# Patient Record
Sex: Female | Born: 1975 | Race: White | Hispanic: No | Marital: Married | Smoking: Never smoker
Health system: Southern US, Community
[De-identification: ages and names within clinical notes are randomized; demographics above are authoritative.]

## PROBLEM LIST (undated history)

## (undated) DIAGNOSIS — G4730 Sleep apnea, unspecified: Secondary | ICD-10-CM

## (undated) DIAGNOSIS — I10 Essential (primary) hypertension: Secondary | ICD-10-CM

## (undated) DIAGNOSIS — E119 Type 2 diabetes mellitus without complications: Secondary | ICD-10-CM

## (undated) HISTORY — PX: KNEE ARTHROSCOPY WITH ANTERIOR CRUCIATE LIGAMENT (ACL) REPAIR: SHX5644

## (undated) HISTORY — PX: HERNIA REPAIR: SHX51

---

## 2010-02-27 ENCOUNTER — Ambulatory Visit (HOSPITAL_COMMUNITY): Admission: RE | Admit: 2010-02-27 | Discharge: 2010-02-27 | Payer: Self-pay | Admitting: Specialist

## 2010-03-28 ENCOUNTER — Ambulatory Visit (HOSPITAL_COMMUNITY): Admission: RE | Admit: 2010-03-28 | Discharge: 2010-03-28 | Payer: Self-pay | Admitting: Specialist

## 2010-04-25 ENCOUNTER — Ambulatory Visit (HOSPITAL_COMMUNITY)
Admission: RE | Admit: 2010-04-25 | Discharge: 2010-04-25 | Payer: Self-pay | Source: Home / Self Care | Admitting: Specialist

## 2010-06-09 ENCOUNTER — Ambulatory Visit: Payer: Self-pay | Admitting: Cardiology

## 2017-11-26 ENCOUNTER — Other Ambulatory Visit: Payer: Self-pay | Admitting: Gastroenterology

## 2017-11-26 DIAGNOSIS — R1011 Right upper quadrant pain: Secondary | ICD-10-CM

## 2017-11-26 DIAGNOSIS — R112 Nausea with vomiting, unspecified: Secondary | ICD-10-CM

## 2017-12-09 ENCOUNTER — Encounter (HOSPITAL_COMMUNITY)
Admission: RE | Admit: 2017-12-09 | Discharge: 2017-12-09 | Disposition: A | Discharge status: Home / Self Care | Payer: BLUE CROSS/BLUE SHIELD | Source: Ambulatory Visit | Attending: Gastroenterology | Admitting: Gastroenterology

## 2017-12-09 ENCOUNTER — Encounter (HOSPITAL_COMMUNITY): Payer: Self-pay | Admitting: Radiology

## 2017-12-09 ENCOUNTER — Ambulatory Visit (HOSPITAL_COMMUNITY)
Admission: RE | Admit: 2017-12-09 | Discharge: 2017-12-09 | Disposition: A | Discharge status: Home / Self Care | Payer: BLUE CROSS/BLUE SHIELD | Source: Ambulatory Visit | Attending: Gastroenterology | Admitting: Gastroenterology

## 2017-12-09 DIAGNOSIS — R1011 Right upper quadrant pain: Secondary | ICD-10-CM

## 2017-12-09 DIAGNOSIS — R112 Nausea with vomiting, unspecified: Secondary | ICD-10-CM | POA: Diagnosis present

## 2017-12-09 MED ORDER — TECHNETIUM TC 99M MEBROFENIN IV KIT
5.1400 | PACK | Freq: Once | INTRAVENOUS | Status: AC | PRN
  Administered 2017-12-09: 5.14 via INTRAVENOUS

## 2017-12-10 ENCOUNTER — Other Ambulatory Visit: Payer: Self-pay

## 2017-12-10 ENCOUNTER — Encounter (HOSPITAL_COMMUNITY): Payer: Self-pay | Admitting: Emergency Medicine

## 2017-12-10 ENCOUNTER — Emergency Department (HOSPITAL_COMMUNITY)
Admission: EM | Admit: 2017-12-10 | Discharge: 2017-12-10 | Disposition: A | Discharge status: Home / Self Care | Payer: BLUE CROSS/BLUE SHIELD | Attending: Emergency Medicine | Admitting: Emergency Medicine

## 2017-12-10 DIAGNOSIS — R112 Nausea with vomiting, unspecified: Secondary | ICD-10-CM

## 2017-12-10 DIAGNOSIS — E119 Type 2 diabetes mellitus without complications: Secondary | ICD-10-CM | POA: Diagnosis not present

## 2017-12-10 DIAGNOSIS — R1013 Epigastric pain: Secondary | ICD-10-CM | POA: Diagnosis present

## 2017-12-10 DIAGNOSIS — I10 Essential (primary) hypertension: Secondary | ICD-10-CM | POA: Diagnosis not present

## 2017-12-10 DIAGNOSIS — R1033 Periumbilical pain: Secondary | ICD-10-CM | POA: Insufficient documentation

## 2017-12-10 DIAGNOSIS — R110 Nausea: Secondary | ICD-10-CM | POA: Insufficient documentation

## 2017-12-10 DIAGNOSIS — Z7984 Long term (current) use of oral hypoglycemic drugs: Secondary | ICD-10-CM | POA: Diagnosis not present

## 2017-12-10 DIAGNOSIS — R1010 Upper abdominal pain, unspecified: Secondary | ICD-10-CM

## 2017-12-10 HISTORY — DX: Sleep apnea, unspecified: G47.30

## 2017-12-10 HISTORY — DX: Essential (primary) hypertension: I10

## 2017-12-10 HISTORY — DX: Type 2 diabetes mellitus without complications: E11.9

## 2017-12-10 LAB — CBC
HCT: 44.7 % (ref 36.0–46.0)
HEMOGLOBIN: 14.7 g/dL (ref 12.0–15.0)
MCH: 27.7 pg (ref 26.0–34.0)
MCHC: 32.9 g/dL (ref 30.0–36.0)
MCV: 84.3 fL (ref 78.0–100.0)
Platelets: 335 10*3/uL (ref 150–400)
RBC: 5.3 MIL/uL — AB (ref 3.87–5.11)
RDW: 13.6 % (ref 11.5–15.5)
WBC: 14.3 10*3/uL — AB (ref 4.0–10.5)

## 2017-12-10 LAB — COMPREHENSIVE METABOLIC PANEL
ALK PHOS: 49 U/L (ref 38–126)
ALT: 46 U/L (ref 14–54)
ANION GAP: 12 (ref 5–15)
AST: 26 U/L (ref 15–41)
Albumin: 3.7 g/dL (ref 3.5–5.0)
BILIRUBIN TOTAL: 0.7 mg/dL (ref 0.3–1.2)
BUN: 20 mg/dL (ref 6–20)
CALCIUM: 9.1 mg/dL (ref 8.9–10.3)
CO2: 23 mmol/L (ref 22–32)
Chloride: 105 mmol/L (ref 101–111)
Creatinine, Ser: 0.75 mg/dL (ref 0.44–1.00)
Glucose, Bld: 175 mg/dL — ABNORMAL HIGH (ref 65–99)
Potassium: 3.7 mmol/L (ref 3.5–5.1)
Sodium: 140 mmol/L (ref 135–145)
TOTAL PROTEIN: 7.5 g/dL (ref 6.5–8.1)

## 2017-12-10 LAB — LIPASE, BLOOD: Lipase: 27 U/L (ref 11–51)

## 2017-12-10 LAB — I-STAT BETA HCG BLOOD, ED (MC, WL, AP ONLY)

## 2017-12-10 MED ORDER — MORPHINE SULFATE (PF) 4 MG/ML IV SOLN
8.0000 mg | Freq: Once | INTRAVENOUS | Status: AC
  Administered 2017-12-10: 4 mg via INTRAVENOUS
  Filled 2017-12-10: qty 2

## 2017-12-10 MED ORDER — ONDANSETRON HCL 4 MG/2ML IJ SOLN: 4.0000 mg | Freq: Once | INTRAMUSCULAR | Status: DC | PRN

## 2017-12-10 MED ORDER — ONDANSETRON HCL 4 MG/2ML IJ SOLN
4.0000 mg | Freq: Once | INTRAMUSCULAR | Status: AC
  Administered 2017-12-10: 4 mg via INTRAVENOUS
  Filled 2017-12-10: qty 2

## 2017-12-10 NOTE — ED Triage Notes (Signed)
Pt presents with a sudden onset of N/V and abdominal pain after waking up around 2300. Patient had a gallbladder scan yesterday morning. Hypotensive in triage.

## 2017-12-10 NOTE — Discharge Instructions (Addendum)
All the results in the ER are normal. We are not sure what is causing your symptoms. The workup in the ER is not complete, and is limited to screening for life threatening and emergent conditions only, so please see a primary care doctor for further evaluation. Continue to take your omeprazole as prescribed.  Please return to the ER if your symptoms worsen; you have increased pain, fevers, chills, inability to keep any medications down, confusion. Otherwise see the outpatient doctor as requested.

## 2017-12-10 NOTE — ED Provider Notes (Signed)
[facility]-EMERGENCY DEPT Provider Note   CSN: [id number] Arrival date & time: 12/10/17  4098     History   Chief Complaint No chief complaint on file.   HPI Sheryl Kramer is a 42 y.o. female.  HPI 42 year old female with history of diabetes, hypertension comes in with chief complaint of abdominal pain.  Patient has had intermittent abdominal discomfort over the past 6 weeks.  Last night patient had her third or fourth episode of this abdominal pain.  Patient states that abdominal pain started all of a sudden, and is located in the epigastric region and periumbilical region.  Typically her pain gets intense and then slowly improves.  Overall the symptoms last for about 12 hours.  There is no specific evoking factors.  Patient has associated nausea without vomiting or diarrhea.  Patient saw Dr. Loreta Ave, GI.  She had an ultrasound and a HIDA scan completed which are both looking normal.  Patient denies any history of GERD or any pelvic disorders.  Patient has no UTI-like symptoms.  Past Medical History:  Diagnosis Date  . Diabetes mellitus without complication (HCC)    type II  . Hypertension   . Sleep apnea   . Sleep apnea with use of continuous positive airway pressure (CPAP)     There are no active problems to display for this patient.     OB History   None      Home Medications    Prior to Admission medications   Medication Sig Start Date End Date Taking? Authorizing Provider  cetirizine (ZYRTEC) 10 MG tablet Take 10 mg by mouth daily.   Yes [provider]  Cinnamon 500 MG TABS Take 1,000 mg by mouth 2 (two) times daily.   Yes [provider]  ferrous sulfate 325 (65 FE) MG tablet Take 325 mg by mouth 2 (two) times daily with a meal.   Yes [provider]  lisinopril (PRINIVIL,ZESTRIL) 10 MG tablet Take 10 mg by mouth daily.   Yes [provider]  metFORMIN (GLUCOPHAGE-XR) 500 MG 24 hr tablet Take 500 mg by  mouth 2 (two) times daily.   Yes [provider]  Multiple Vitamin (MULTIVITAMIN WITH MINERALS) TABS tablet Take 1 tablet by mouth daily.   Yes [provider]  norethindrone-ethinyl estradiol (JUNEL FE,GILDESS FE,LOESTRIN FE) 1-20 MG-MCG tablet Take 1 tablet by mouth daily.   Yes [provider]  Omega-3 Fatty Acids (FISH OIL) 1000 MG CAPS Take 1,000 mg by mouth daily.   Yes [provider]  omeprazole (PRILOSEC) 40 MG capsule Take 40 mg by mouth daily.   Yes [provider]  TURMERIC PO Take 1 tablet by mouth daily.   Yes [provider]  venlafaxine XR (EFFEXOR-XR) 150 MG 24 hr capsule Take 150 mg by mouth daily.   Yes [provider]    Family History History reviewed. No pertinent family history.  Social History Social History   Tobacco Use  . Smoking status: Never Smoker  . Smokeless tobacco: Never Used  Substance Use Topics  . Alcohol use: Yes    Comment: Rarely  . Drug use: Not on file     Allergies   Patient has no known allergies.   Review of Systems Review of Systems  Constitutional: Positive for activity change.  Gastrointestinal: Positive for abdominal pain.  Genitourinary: Negative for flank pain.  Hematological: Does not bruise/bleed easily.  All other systems reviewed and are negative.    Physical Exam  Updated Vital Signs BP 118/67 (BP Location: Right Arm)   Pulse 87   Temp (!) 97.5 F (36.4 C) (Oral)   Resp 16   Ht 5\' 3"  (1.6 m)   Wt 118.8 kg (262 lb)   LMP 11/28/2017   SpO2 97%   BMI 46.41 kg/m   Physical Exam  Constitutional: She is oriented to person, place, and time. She appears well-developed.  HENT:  Head: Normocephalic and atraumatic.  Eyes: EOM are normal.  Neck: Normal range of motion. Neck supple.  Cardiovascular: Normal rate.  Pulmonary/Chest: Effort normal.  Abdominal: Soft. Bowel sounds are normal. She exhibits no distension. There is tenderness. There is guarding.  There is no rebound.  Neurological: She is alert and oriented to person, place, and time.  Skin: Skin is warm and dry.  Nursing note and vitals reviewed.    ED Treatments / Results  Labs (all labs ordered are listed, but only abnormal results are displayed) Labs Reviewed  COMPREHENSIVE METABOLIC PANEL - Abnormal; Notable for the following components:      Result Value   Glucose, Bld 175 (*)    All other components within normal limits  CBC - Abnormal; Notable for the following components:   WBC 14.3 (*)    RBC 5.30 (*)    All other components within normal limits  LIPASE, BLOOD  URINALYSIS, ROUTINE W REFLEX MICROSCOPIC  I-STAT BETA HCG BLOOD, ED ([facility], [facility], [facility] ONLY)    EKG None  Radiology [place] Abdomen Complete  Result Date: 12/09/2017 CLINICAL DATA:  Nausea vomiting and right upper quadrant pain EXAM: ABDOMEN ULTRASOUND COMPLETE COMPARISON:  None in PACs FINDINGS: Gallbladder: No gallstones or wall thickening visualized. No sonographic Murphy sign noted by sonographer. Common bile duct: Diameter: 5.3 mm Liver: The hepatic echotexture is heterogeneously increased. The surface contour is smooth. There is no focal mass nor ductal dilation. Portal vein is patent on color Doppler imaging with normal direction of blood flow towards the liver. IVC: No abnormality visualized. Pancreas: There is limited visualization of the pancreatic head and tail. The pancreatic body is grossly normal. Spleen: Size and appearance within normal limits. Right Kidney: Length: The right kidney is elongated measuring 15.3 cm in length. Echogenicity within normal limits. No mass or hydronephrosis visualized. Left Kidney: Length: 13.5 cm. Echogenicity within normal limits. No mass or hydronephrosis visualized. Abdominal aorta: No aneurysm visualized. Other findings: None. IMPRESSION: No gallstones or sonographic evidence of acute cholecystitis. Normal appearance of the liver, spleen, and visualized portions of the  pancreas. Slightly elongated right kidney likely reflecting the patient's body habitus. No hydronephrosis. Electronically Signed   By: David  SwazilandJordan M.D.   On: 12/09/2017 08:41   Nm Hepato W/eject Fract  Result Date: 12/09/2017 CLINICAL DATA:  Right upper quadrant pain and nausea EXAM: NUCLEAR MEDICINE HEPATOBILIARY IMAGING WITH GALLBLADDER EF VIEWS: Anterior, right lateral right upper quadrant RADIOPHARMACEUTICALS:  5.14 mCi [id number]  Choletec IV COMPARISON:  None. FINDINGS: Liver uptake of radiotracer is normal. There is prompt visualization of gallbladder and small bowel, indicating patency of the cystic and common bile ducts. The patient consumed 8 ounces of Ensure orally with calculation of the computer generated ejection fraction of radiotracer from the gallbladder. The patient had mild pain with the oral Ensure consumption. The computer generated ejection fraction of radiotracer from the gallbladder is normal at 82%, normal greater than 33% using the oral agent. IMPRESSION: Normal ejection fraction of radiotracer from the gallbladder. The patient experienced mild pain with the oral Ensure  consumption. Cystic and common bile ducts are patent as is evidenced by visualization of gallbladder and small bowel. Electronically Signed   By: Bretta Bang III M.D.   On: 12/09/2017 12:37    Procedures Procedures (including critical care time)  Medications Ordered in ED Medications  ondansetron (ZOFRAN) injection 4 mg (has no administration in time range)  morphine 4 MG/ML injection 8 mg (4 mg Intravenous Given 12/10/17 0501)  ondansetron (ZOFRAN) injection 4 mg (4 mg Intravenous Given 12/10/17 0501)     Initial Impression / Assessment and Plan / ED Course  I have reviewed the triage vital signs and the nursing notes.  Pertinent labs & imaging results that were available during my care of the patient were reviewed by me and considered in my medical decision making (see chart for details).  Clinical  Course as of Dec 10 833  Tue Dec 10, 2017  0625 Pt reassessed. Pt's VSS and WNL. Pt's cap refill < 3 seconds. Pt has been hydrated in the ER and now passed po challenge. We will discharge with antiemetic. Strict ER return precautions have been discussed and pt will return if he is unable to tolerate fluids and symptoms are getting worse.  Abdominal pain continues to improve. Patient does not have any focal peritoneal signs. We will start her on omeprazole.  Patient advised to follow-up with GI doctor soon as possible.  She will return to the ER if her pain gets severe, she starts having fevers or is unable to keep any medications down.  Also she will return if she has bloody stools or vomit.   [AN]    Clinical Course User Index [AN] Derwood Kaplan, MD   \DDx includes: Pancreatitis Hepatobiliary pathology including cholecystitis Gastritis/PUD SBO ACS syndrome Thrombosis  42 year old female comes in with chief complaint of epigastric abdominal pain.  Patient abdominal pain is located in the periumbilical and epigastric region.  Patient has guarding.  Patient has had this abdominal pain in the past.  Typically it comes on about unprovoked and last for several hours.  Patient's pain is improving at this time.  She had a HIDA scan and ultrasound right upper quadrant which are both normal.  Patient's white count is elevated at 14.3.  Likely nonspecific.  I discussed with the patient that I do not see any indication for CT scan at this time.  If the patient's pain continues I have informed her to come back to the ER, at which time we should get a CT scan to make sure there is no other etiologies such as thrombosis given that patient is taking birth control pills.  For now I have advised patient to see GI doctor.  Final Clinical Impressions(s) / ED Diagnoses   Final diagnoses:  Pain of upper abdomen  Non-intractable vomiting with nausea, unspecified vomiting type    ED Discharge Orders     None       Derwood Kaplan, MD 12/10/17 [id number]

## 2019-11-28 IMAGING — US US ABDOMEN COMPLETE
1 series · 13 of 25 positions shown · non-contrast
Comparison: None in PACs

CLINICAL DATA: Nausea vomiting and right upper quadrant pain

EXAM:
ABDOMEN ULTRASOUND COMPLETE

[Series 1: us abdomen complete · 0.26mm/px · 13 of 76 slices shown]
[im 1/76]
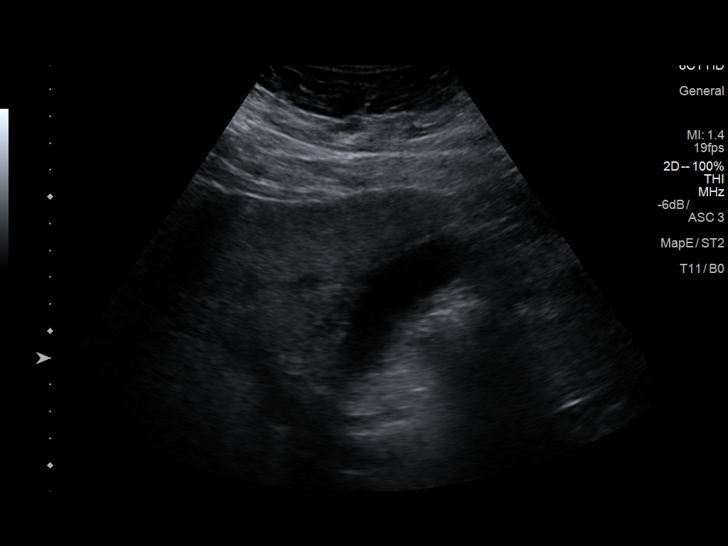
[im 7/76]
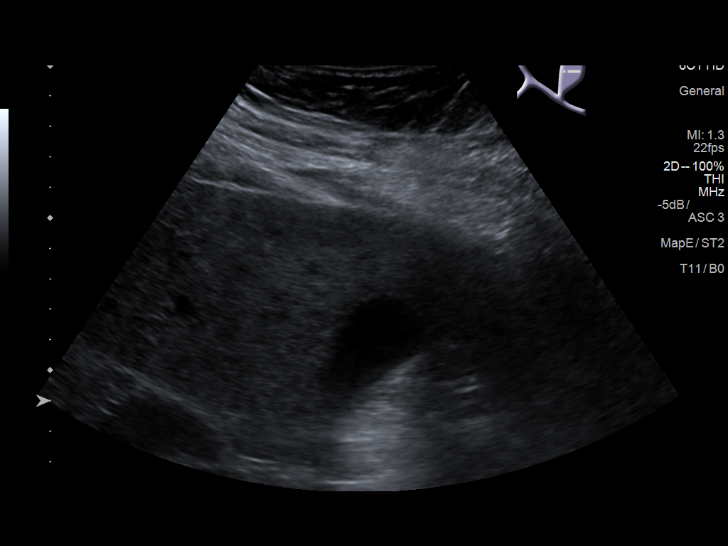
[im 13/76]
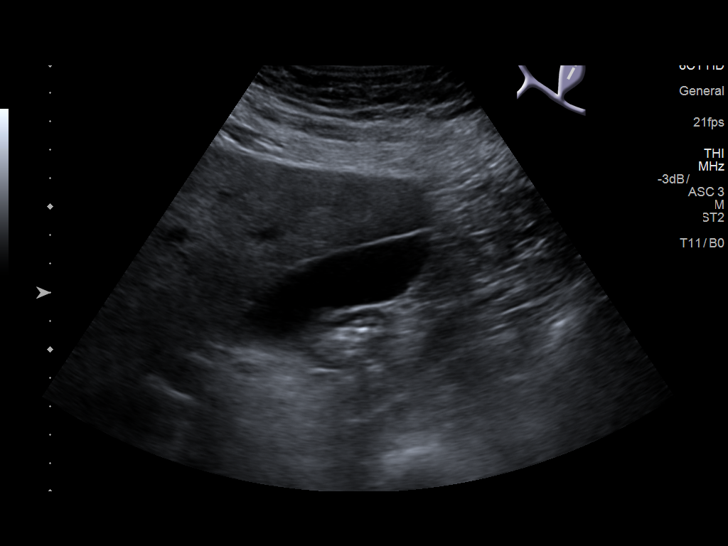
[im 19/76]
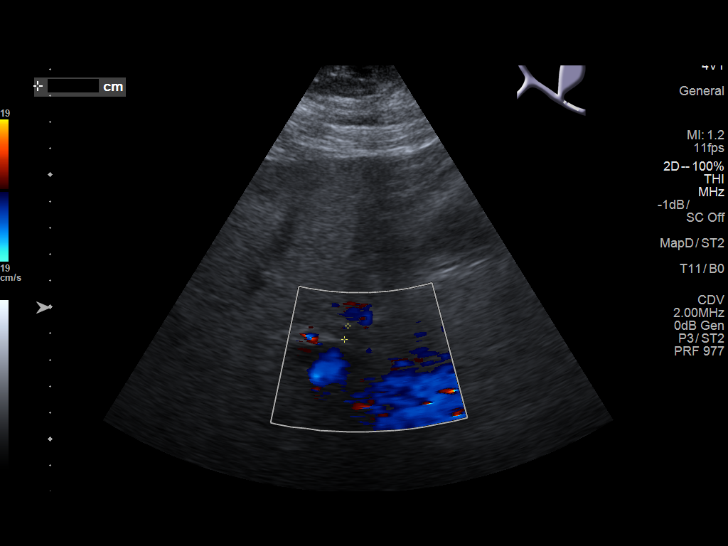
[im 26/76]
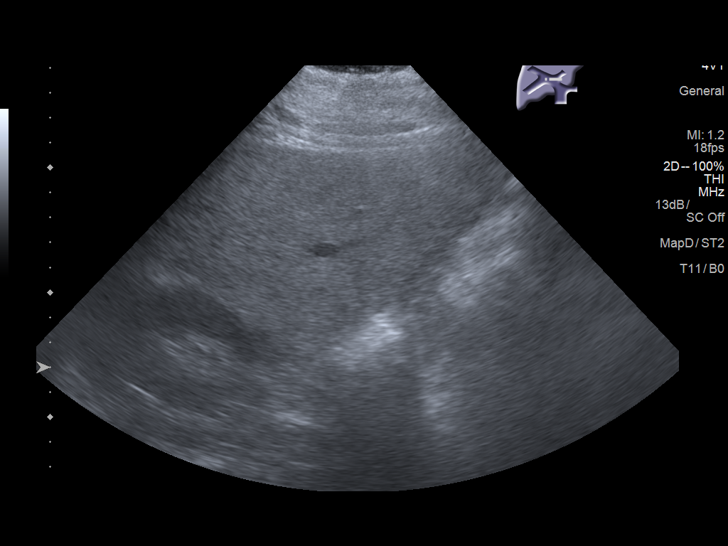
[im 32/76]
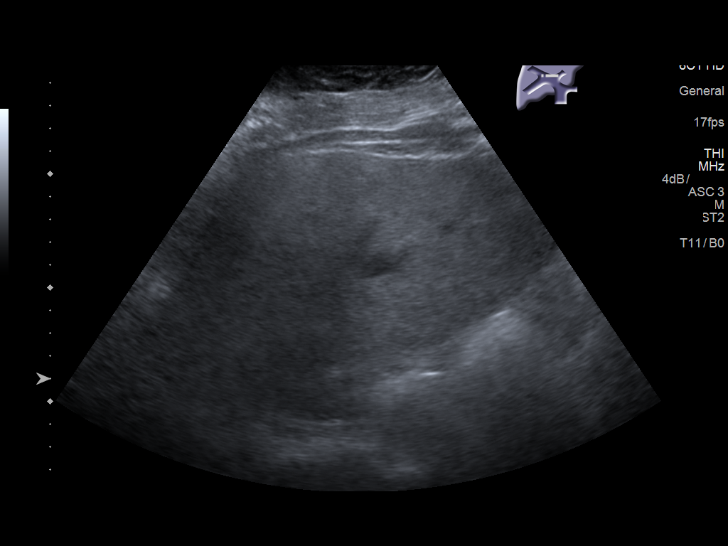
[im 38/76]
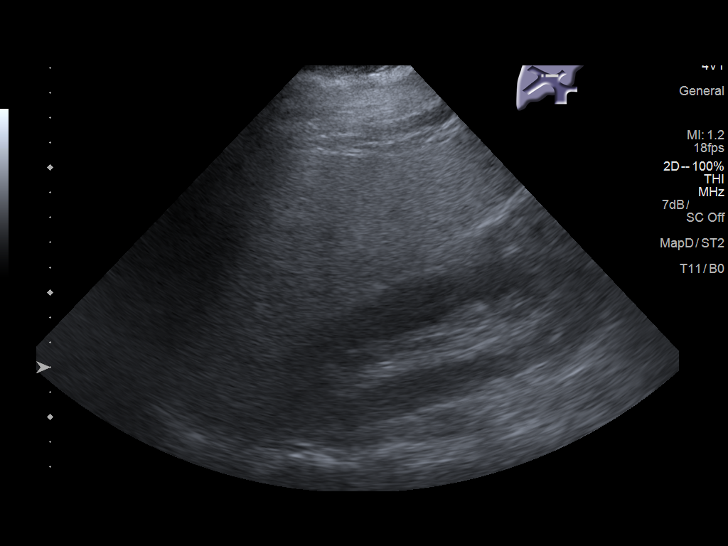
[im 44/76]
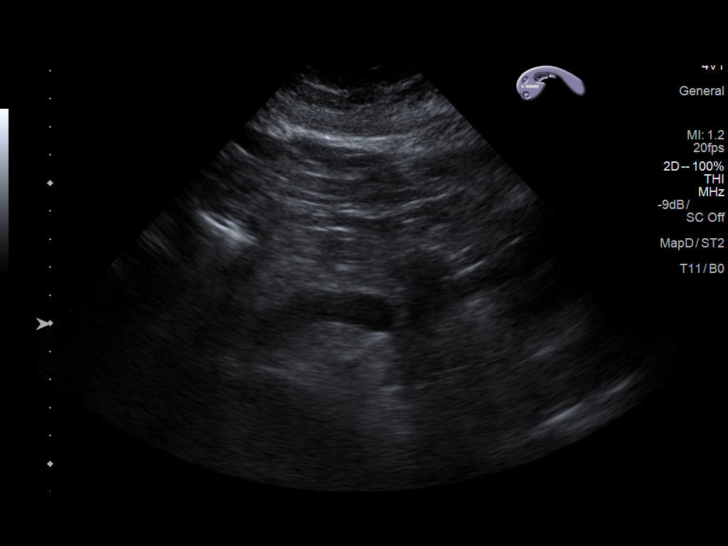
[im 51/76]
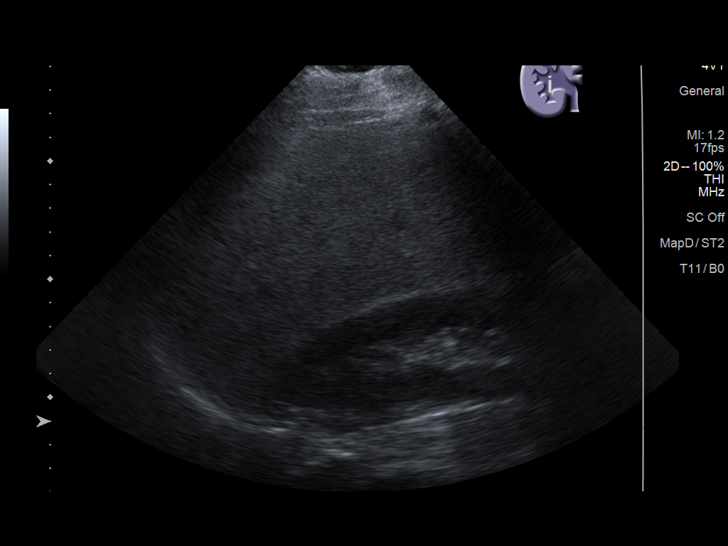
[im 57/76]
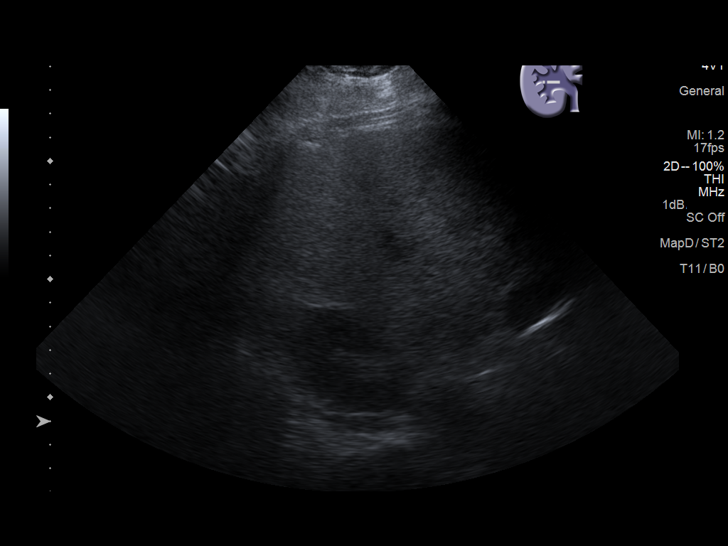
[im 63/76]
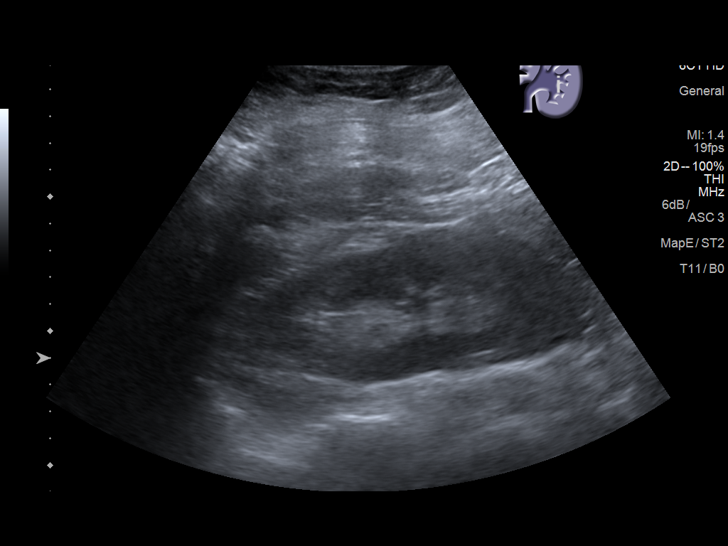
[im 69/76]
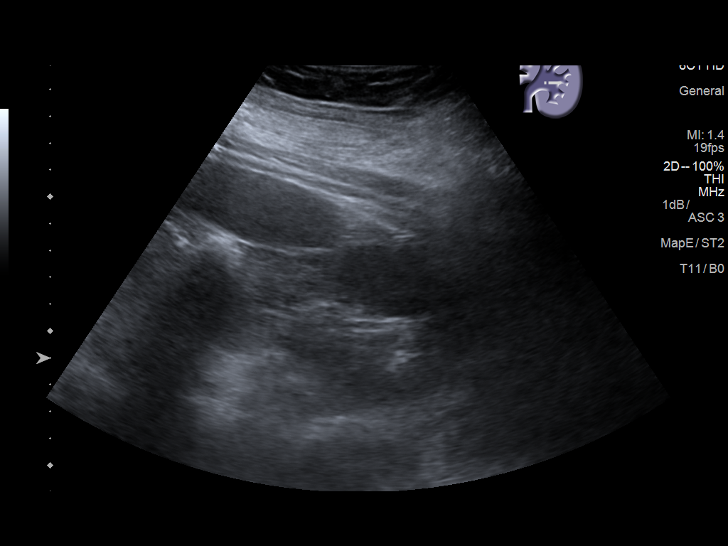
[im 76/76]
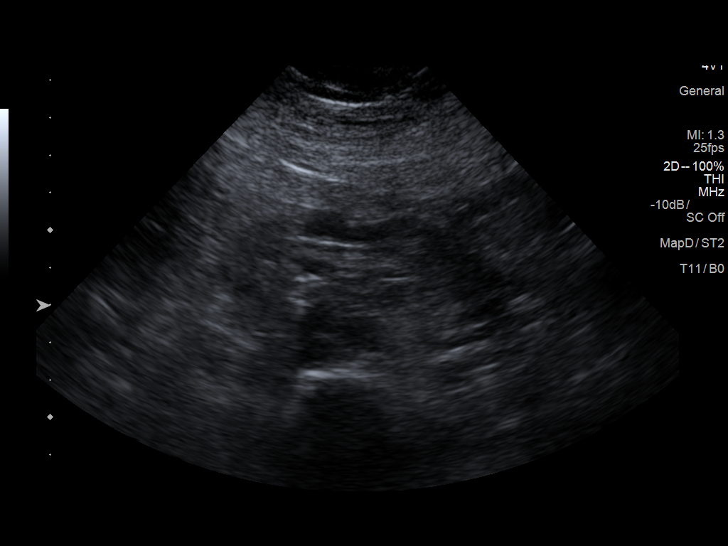

[13 of 25 positions shown; findings below may reference images not displayed]

FINDINGS: Gallbladder: No gallstones or wall thickening visualized. No
sonographic Murphy sign noted by sonographer.

Common bile duct: Diameter: 5.3 mm

Liver: The hepatic echotexture is heterogeneously increased. The
surface contour is smooth. There is no focal mass nor ductal
dilation. Portal vein is patent on color Doppler imaging with normal
direction of blood flow towards the liver.

IVC: No abnormality visualized.

Pancreas: There is limited visualization of the pancreatic head and
tail. The pancreatic body is grossly normal.

Spleen: Size and appearance within normal limits.

Right Kidney: Length: The right kidney is elongated measuring
cm in length.. Echogenicity within normal limits. No mass or
hydronephrosis visualized.

Left Kidney: Length: 13.5 cm. Echogenicity within normal limits. No
mass or hydronephrosis visualized.

Abdominal aorta: No aneurysm visualized.

Other findings: None.
IMPRESSION: No gallstones or sonographic evidence of acute cholecystitis.

Normal appearance of the liver, spleen, and visualized portions of
the pancreas.

Slightly elongated right kidney likely reflecting the patient's body
habitus. No hydronephrosis.

## 2020-02-01 IMAGING — NM NM HEPATO W/GB/PHARM/[PERSON_NAME]
2 series · 12 of 12 positions shown · non-contrast
Comparison: None.

CLINICAL DATA: Right upper quadrant pain and nausea

EXAM:
NUCLEAR MEDICINE HEPATOBILIARY IMAGING WITH GALLBLADDER EF
VIEWS:
Anterior, right lateral right upper quadrant
RADIOPHARMACEUTICALS:  5.14 mCi Wc-UUm  Choletec IV

[he hepatobiliary · 3.10mm/px · 6 of 60 frames shown (1 of 2)]
[frame 6/60]
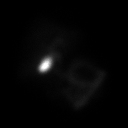
[frame 16/60]
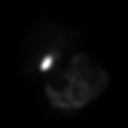
[frame 26/60]
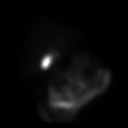
[frame 36/60]
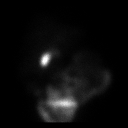
[frame 46/60]
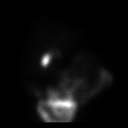
[frame 56/60]
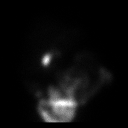

[he hepatobiliary · 3.10mm/px · 6 of 60 frames shown (2 of 2)]
[frame 6/60]
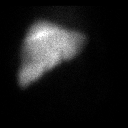
[frame 16/60]
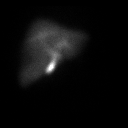
[frame 26/60]
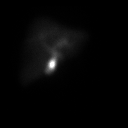
[frame 36/60]
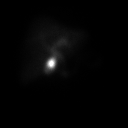
[frame 46/60]
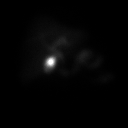
[frame 56/60]
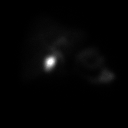

[12 of 12 positions shown; findings below may reference images not displayed]

FINDINGS: Liver uptake of radiotracer is normal. There is prompt visualization
of gallbladder and small bowel, indicating patency of the cystic and
common bile ducts. The patient consumed 8 ounces of Ensure orally
with calculation of the computer generated ejection fraction of
radiotracer from the gallbladder. The patient had mild pain with the
oral Ensure consumption. The computer generated ejection fraction of
radiotracer from the gallbladder is normal at 82%, normal greater
than 33% using the oral agent.
IMPRESSION: Normal ejection fraction of radiotracer from the gallbladder. The
patient experienced mild pain with the oral Ensure consumption.
Cystic and common bile ducts are patent as is evidenced by
visualization of gallbladder and small bowel.
